# Patient Record
Sex: Male | Born: 1975 | Race: Black or African American | Hispanic: No | Marital: Single | Smoking: Never smoker
Health system: Southern US, Community
[De-identification: ages and names within clinical notes are randomized; demographics above are authoritative.]

## PROBLEM LIST (undated history)

## (undated) DIAGNOSIS — J45909 Unspecified asthma, uncomplicated: Secondary | ICD-10-CM

## (undated) HISTORY — PX: NASAL SINUS SURGERY: SHX719

---

## 2000-10-24 ENCOUNTER — Encounter: Payer: Self-pay | Admitting: Otolaryngology

## 2000-10-24 ENCOUNTER — Encounter: Admission: RE | Admit: 2000-10-24 | Discharge: 2000-10-24 | Payer: Self-pay | Admitting: Otolaryngology

## 2001-04-13 ENCOUNTER — Emergency Department (HOSPITAL_COMMUNITY): Admission: EM | Admit: 2001-04-13 | Discharge: 2001-04-13 | Payer: Self-pay | Admitting: Emergency Medicine

## 2001-08-21 ENCOUNTER — Encounter: Admission: RE | Admit: 2001-08-21 | Discharge: 2001-08-21 | Payer: Self-pay | Admitting: Otolaryngology

## 2001-08-21 ENCOUNTER — Encounter: Payer: Self-pay | Admitting: Otolaryngology

## 2001-08-31 ENCOUNTER — Other Ambulatory Visit: Admission: RE | Admit: 2001-08-31 | Discharge: 2001-08-31 | Payer: Self-pay | Admitting: Otolaryngology

## 2004-01-02 ENCOUNTER — Encounter: Admission: RE | Admit: 2004-01-02 | Discharge: 2004-01-02 | Payer: Self-pay | Admitting: Family Medicine

## 2004-08-17 ENCOUNTER — Encounter: Admission: RE | Admit: 2004-08-17 | Discharge: 2004-08-17 | Payer: Self-pay | Admitting: Otolaryngology

## 2004-12-21 ENCOUNTER — Encounter: Admission: RE | Admit: 2004-12-21 | Discharge: 2004-12-21 | Payer: Self-pay | Admitting: Family Medicine

## 2005-01-05 ENCOUNTER — Ambulatory Visit (HOSPITAL_COMMUNITY): Admission: RE | Admit: 2005-01-05 | Discharge: 2005-01-05 | Payer: Self-pay | Admitting: Otolaryngology

## 2005-01-05 ENCOUNTER — Ambulatory Visit (HOSPITAL_BASED_OUTPATIENT_CLINIC_OR_DEPARTMENT_OTHER): Admission: RE | Admit: 2005-01-05 | Discharge: 2005-01-05 | Payer: Self-pay | Admitting: Otolaryngology

## 2006-05-22 ENCOUNTER — Emergency Department (HOSPITAL_COMMUNITY): Admission: EM | Admit: 2006-05-22 | Discharge: 2006-05-22 | Payer: Self-pay | Admitting: Family Medicine

## 2006-12-23 ENCOUNTER — Emergency Department (HOSPITAL_COMMUNITY): Admission: EM | Admit: 2006-12-23 | Discharge: 2006-12-24 | Payer: Self-pay | Admitting: Emergency Medicine

## 2013-01-20 ENCOUNTER — Ambulatory Visit (INDEPENDENT_AMBULATORY_CARE_PROVIDER_SITE_OTHER): Payer: BC Managed Care – PPO | Admitting: Family Medicine

## 2013-01-20 VITALS — BP 136/78 | HR 66 | Temp 98.6°F | Resp 16 | Ht 70.58 in | Wt 223.4 lb

## 2013-01-20 DIAGNOSIS — L0201 Cutaneous abscess of face: Secondary | ICD-10-CM

## 2013-01-20 MED ORDER — CEFTRIAXONE SODIUM 1 G IJ SOLR
1.0000 g | INTRAMUSCULAR | Status: AC
  Administered 2013-01-20: 1 g via INTRAMUSCULAR

## 2013-01-20 MED ORDER — DOXYCYCLINE HYCLATE 100 MG PO TABS: 100.0000 mg | ORAL_TABLET | Freq: Two times a day (BID) | ORAL | Status: AC

## 2013-01-20 NOTE — Patient Instructions (Addendum)
Start the oral antibiotic this afternoon- take 1 pill twice a day with food and water. You may take ibuprofen for pain and swelling- up to 800 mg three times a day Apply hot compresses to help the area drain, and ice as needed for pain and swelling Take it easy and drink plenty of fluids If you are not better in the next 36 hours please come back in or call.

## 2013-01-20 NOTE — Progress Notes (Signed)
[facility] [address], [place] [address] [phone]  Date:  01/20/2013   Name:  Kyle Hill   DOB:  1976-08-30   MRN:  [id number]  PCP:  No primary provider on file.    Chief Complaint: Abscess   History of Present Illness:  Kyle Hill is a 37 y.o. very pleasant male patient who presents with the following:  He has a very sore lesion on his upper lip- seems like a boil.  It seems to be getting larger.  He has tried to squeeze it but has not gotten any discharge yet.  It really hurts to try and squeeze it.  He has never had anything like this in the past- he has had "hair bumps" but nothing this severe He otherwise feels fine, no fever, no headache.  He does feel a little bit tired but knows he has not been sleeping enough  He is generally healthy and has no other concerns today  There is no problem list on file for this patient.   No past medical history on file.  No past surgical history on file.  History  Substance Use Topics  . Smoking status: Never Smoker   . Smokeless tobacco: Not on file  . Alcohol Use: 0.6 oz/week    1 Glasses of wine per week    Family History  Problem Relation Age of Onset  . Heart disease Mother   . Stroke Father     No Known Allergies  Medication list has been reviewed and updated.  No current outpatient prescriptions on file prior to visit.   No current facility-administered medications on file prior to visit.    Review of Systems:  As per HPI- otherwise negative.   Physical Examination: Filed Vitals:   01/20/13 1129  BP: 136/78  Pulse: 66  Temp: 98.6 F (37 C)  Resp: 16   Filed Vitals:   01/20/13 1129  Height: 5' 10.58" (1.793 m)  Weight: 223 lb 6.4 oz (101.334 kg)   Body mass index is 31.52 kg/(m^2). Ideal Body Weight: Weight in (lb) to have BMI = 25: 176.8  GEN: WDWN, NAD, Non-toxic, A & O x 3 HEENT: Atraumatic, Normocephalic. Neck supple. No masses, No LAD.  Bilateral TM wnl,  oropharynx normal.  PEERL,EOMI.   On the right side of his upper lip, just below the right nare is a very tender, slightly swollen, slightly warm area of cellulitis.  There is no fluctuance or drainage.   Ears and Nose: No external deformity. CV: RRR, No M/G/R. No JVD. No thrill. No extra heart sounds. PULM: CTA B, no wheezes, crackles, rhonchi. No retractions. No resp. distress. No accessory muscle use. EXTR: No c/c/e NEURO Normal gait.  PSYCH: Normally interactive. Conversant. Not depressed or anxious appearing.  Calm demeanor.    Assessment and Plan: Cellulitis and abscess of face - Plan: cefTRIAXone (ROCEPHIN) injection 1 g, doxycycline (VIBRA-TABS) 100 MG tablet  Treat with rocephin and doxycycline, ibuprofen as needed.  Encouraged close follow- up if not better, Sooner if worse.   See patient instructions for more details.     Abbe Amsterdam, MD

## 2016-02-24 DIAGNOSIS — H40023 Open angle with borderline findings, high risk, bilateral: Secondary | ICD-10-CM | POA: Diagnosis not present

## 2016-06-09 ENCOUNTER — Other Ambulatory Visit: Payer: Self-pay | Admitting: Family Medicine

## 2016-06-09 DIAGNOSIS — R1031 Right lower quadrant pain: Secondary | ICD-10-CM | POA: Diagnosis not present

## 2016-06-10 ENCOUNTER — Other Ambulatory Visit: Payer: Self-pay | Admitting: Family Medicine

## 2016-06-10 DIAGNOSIS — R102 Pelvic and perineal pain: Secondary | ICD-10-CM | POA: Diagnosis not present

## 2016-06-10 DIAGNOSIS — M799 Soft tissue disorder, unspecified: Secondary | ICD-10-CM | POA: Diagnosis not present

## 2016-06-10 DIAGNOSIS — R1031 Right lower quadrant pain: Secondary | ICD-10-CM

## 2016-06-10 DIAGNOSIS — R1030 Lower abdominal pain, unspecified: Secondary | ICD-10-CM | POA: Diagnosis not present

## 2016-06-28 DIAGNOSIS — L662 Folliculitis decalvans: Secondary | ICD-10-CM | POA: Diagnosis not present

## 2016-06-28 DIAGNOSIS — L739 Follicular disorder, unspecified: Secondary | ICD-10-CM | POA: Diagnosis not present

## 2016-06-29 DIAGNOSIS — L02214 Cutaneous abscess of groin: Secondary | ICD-10-CM | POA: Diagnosis not present

## 2016-07-23 ENCOUNTER — Other Ambulatory Visit: Payer: Self-pay | Admitting: Surgery

## 2016-07-23 DIAGNOSIS — L02214 Cutaneous abscess of groin: Secondary | ICD-10-CM | POA: Diagnosis not present

## 2016-08-12 ENCOUNTER — Other Ambulatory Visit: Payer: Self-pay | Admitting: Surgery

## 2016-08-12 DIAGNOSIS — L02214 Cutaneous abscess of groin: Secondary | ICD-10-CM | POA: Diagnosis not present

## 2016-08-20 DIAGNOSIS — Z0000 Encounter for general adult medical examination without abnormal findings: Secondary | ICD-10-CM | POA: Diagnosis not present

## 2016-08-20 DIAGNOSIS — R7309 Other abnormal glucose: Secondary | ICD-10-CM | POA: Diagnosis not present

## 2016-12-29 DIAGNOSIS — L662 Folliculitis decalvans: Secondary | ICD-10-CM | POA: Diagnosis not present

## 2016-12-29 DIAGNOSIS — L739 Follicular disorder, unspecified: Secondary | ICD-10-CM | POA: Diagnosis not present

## 2017-09-05 DIAGNOSIS — Z131 Encounter for screening for diabetes mellitus: Secondary | ICD-10-CM | POA: Diagnosis not present

## 2017-09-05 DIAGNOSIS — Z0000 Encounter for general adult medical examination without abnormal findings: Secondary | ICD-10-CM | POA: Diagnosis not present

## 2017-09-05 DIAGNOSIS — Z13220 Encounter for screening for lipoid disorders: Secondary | ICD-10-CM | POA: Diagnosis not present

## 2017-09-14 DIAGNOSIS — L662 Folliculitis decalvans: Secondary | ICD-10-CM | POA: Diagnosis not present

## 2017-09-14 DIAGNOSIS — L739 Follicular disorder, unspecified: Secondary | ICD-10-CM | POA: Diagnosis not present

## 2018-01-17 DIAGNOSIS — L089 Local infection of the skin and subcutaneous tissue, unspecified: Secondary | ICD-10-CM | POA: Diagnosis not present

## 2018-01-17 DIAGNOSIS — L662 Folliculitis decalvans: Secondary | ICD-10-CM | POA: Diagnosis not present

## 2018-01-17 DIAGNOSIS — L739 Follicular disorder, unspecified: Secondary | ICD-10-CM | POA: Diagnosis not present

## 2018-03-01 DIAGNOSIS — L662 Folliculitis decalvans: Secondary | ICD-10-CM | POA: Diagnosis not present

## 2018-03-01 DIAGNOSIS — L089 Local infection of the skin and subcutaneous tissue, unspecified: Secondary | ICD-10-CM | POA: Diagnosis not present

## 2019-09-18 ENCOUNTER — Encounter (INDEPENDENT_AMBULATORY_CARE_PROVIDER_SITE_OTHER): Payer: Self-pay

## 2020-11-12 DIAGNOSIS — L662 Folliculitis decalvans: Secondary | ICD-10-CM | POA: Diagnosis not present

## 2020-11-12 DIAGNOSIS — L089 Local infection of the skin and subcutaneous tissue, unspecified: Secondary | ICD-10-CM | POA: Diagnosis not present

## 2021-01-20 ENCOUNTER — Other Ambulatory Visit: Payer: Self-pay | Admitting: Family Medicine

## 2021-01-20 ENCOUNTER — Ambulatory Visit
Admission: RE | Admit: 2021-01-20 | Discharge: 2021-01-20 | Disposition: A | Discharge status: Home / Self Care | Payer: Self-pay | Source: Ambulatory Visit | Attending: Family Medicine | Admitting: Family Medicine

## 2021-01-20 DIAGNOSIS — R140 Abdominal distension (gaseous): Secondary | ICD-10-CM

## 2021-01-20 DIAGNOSIS — R350 Frequency of micturition: Secondary | ICD-10-CM | POA: Diagnosis not present

## 2021-08-06 DIAGNOSIS — L308 Other specified dermatitis: Secondary | ICD-10-CM | POA: Diagnosis not present

## 2021-08-20 DIAGNOSIS — H524 Presbyopia: Secondary | ICD-10-CM | POA: Diagnosis not present

## 2021-08-20 DIAGNOSIS — H5310 Unspecified subjective visual disturbances: Secondary | ICD-10-CM | POA: Diagnosis not present

## 2021-08-24 ENCOUNTER — Ambulatory Visit (INDEPENDENT_AMBULATORY_CARE_PROVIDER_SITE_OTHER): Payer: Self-pay | Admitting: Otolaryngology

## 2021-09-14 ENCOUNTER — Ambulatory Visit (INDEPENDENT_AMBULATORY_CARE_PROVIDER_SITE_OTHER): Payer: BLUE CROSS/BLUE SHIELD | Admitting: Otolaryngology

## 2021-09-14 ENCOUNTER — Other Ambulatory Visit: Payer: Self-pay

## 2021-09-14 DIAGNOSIS — J310 Chronic rhinitis: Secondary | ICD-10-CM | POA: Diagnosis not present

## 2021-09-14 MED ORDER — TRIAMCINOLONE ACETONIDE 55 MCG/ACT NA AERO: 2.0000 | INHALATION_SPRAY | Freq: Every day | NASAL | 12 refills | Status: AC

## 2021-09-14 NOTE — Progress Notes (Signed)
HPI: Kyle Hill is a 45 y.o. male who returns today for evaluation of intermittent nasal congestion and allergies.  Patient is presently using Flonase as well as occasional Zyrtec.  He has had previous surgery with myself for removal of sinonasal polyps in 1994, 2002 and 2006.  He is doing reasonly well presently he has had occasional sinus infection where he has yellow-green discharge but no symptoms presently.Marland Kitchen  No past medical history on file.  Social History   Socioeconomic History   Marital status: Married    Spouse name: Not on file   Number of children: Not on file   Years of education: Not on file   Highest education level: Not on file  Occupational History   Not on file  Tobacco Use   Smoking status: Never   Smokeless tobacco: Not on file  Substance and Sexual Activity   Alcohol use: Yes    Alcohol/week: 1.0 standard drink    Types: 1 Glasses of wine per week   Drug use: No   Sexual activity: Yes  Other Topics Concern   Not on file  Social History Narrative   Not on file   Social Determinants of Health   Financial Resource Strain: Not on file  Food Insecurity: Not on file  Transportation Needs: Not on file  Physical Activity: Not on file  Stress: Not on file  Social Connections: Not on file   Family History  Problem Relation Age of Onset   Heart disease Mother    Stroke Father    No Known Allergies Prior to Admission medications   Medication Sig Start Date End Date Taking? Authorizing Provider  doxycycline (VIBRA-TABS) 100 MG tablet Take 1 tablet (100 mg total) by mouth 2 (two) times daily. 01/20/13   Copland, Gwenlyn Found, MD     Positive ROS: Otherwise negative  All other systems have been reviewed and were otherwise negative with the exception of those mentioned in the HPI and as above.  Physical Exam: Constitutional: Alert, well-appearing, no acute distress Ears: External ears without lesions or tenderness. Ear canals are clear bilaterally with  intact, clear TMs.  Nasal: External nose without lesions. Septum midline with mild rhinitis.  On nasal endoscopy he had minimal polypoid changes on the left side that is nonobstructing.  No obvious mucopurulent discharge noted.  The nasopharynx was clear.  No significant polyps noted on either side.  Nasopharynx is otherwise clear. Oral: Lips and gums without lesions. Tongue and palate mucosa without lesions. Posterior oropharynx clear. Neck: No palpable adenopathy or masses Respiratory: Breathing comfortably  Skin: No facial/neck lesions or rash noted.  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 Doing well presently.  Plan: Discussed with him concerning regular use of nasal steroid spray either Flonase or Nasacort if he is having any nasal sinus congestion.  Presently no evidence of active infection and nasal passages are clear. Also reviewed with him concerning use of saline nasal irrigations and suggested trying AYR and Xlear saline rinses. He will follow-up as needed. Also discussed with him concerning my retirement and need for follow-up with one of the other ENT groups if he has trouble with his breathing or sinus infections. Refilled Nasacort for him   Narda Bonds, MD

## 2021-09-29 IMAGING — DX DG ABDOMEN 2V
4 series · 4 of 4 positions shown · non-contrast
Comparison: None.

CLINICAL DATA: Abdominal bloating for 6 months.

EXAM:
ABDOMEN - 2 VIEW

[dg abd 2 views (1 of 4)]
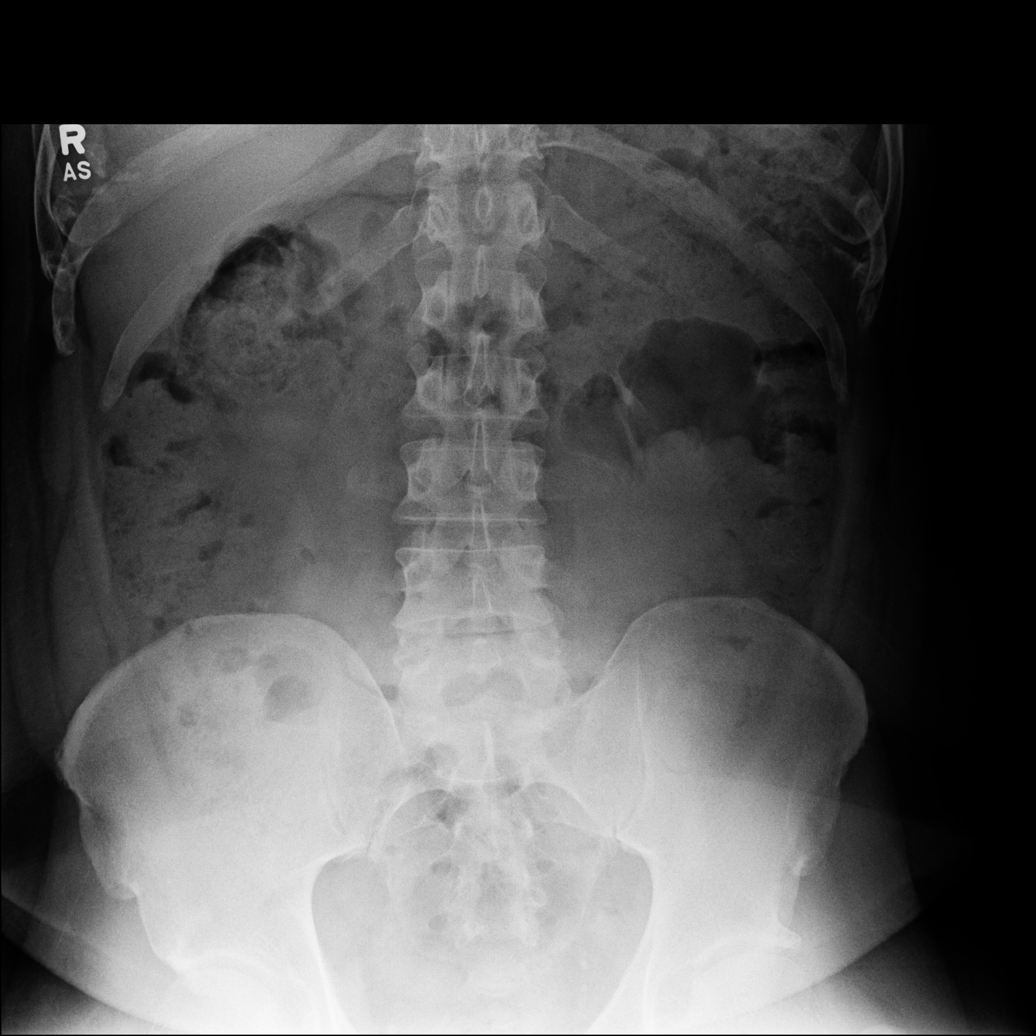

[dg abd 2 views (2 of 4)]
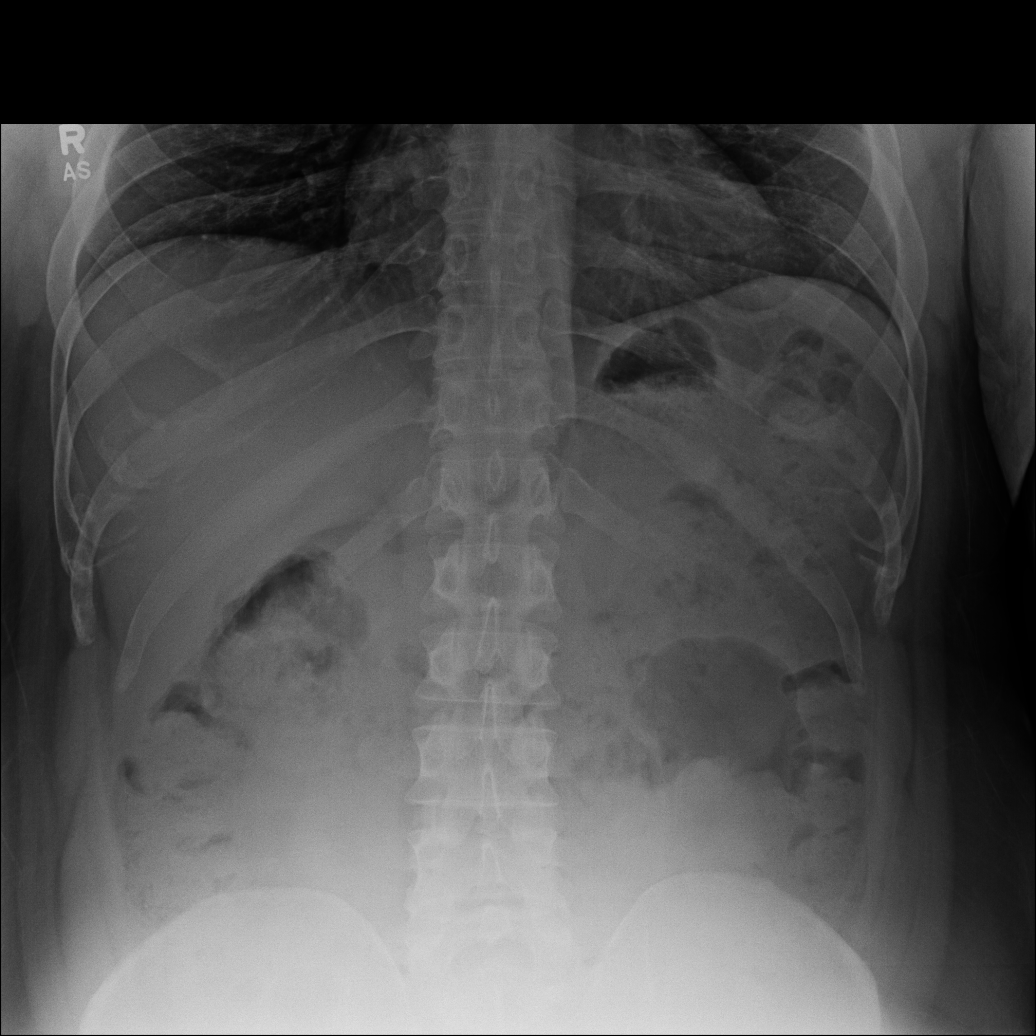

[dg abd 2 views (3 of 4)]
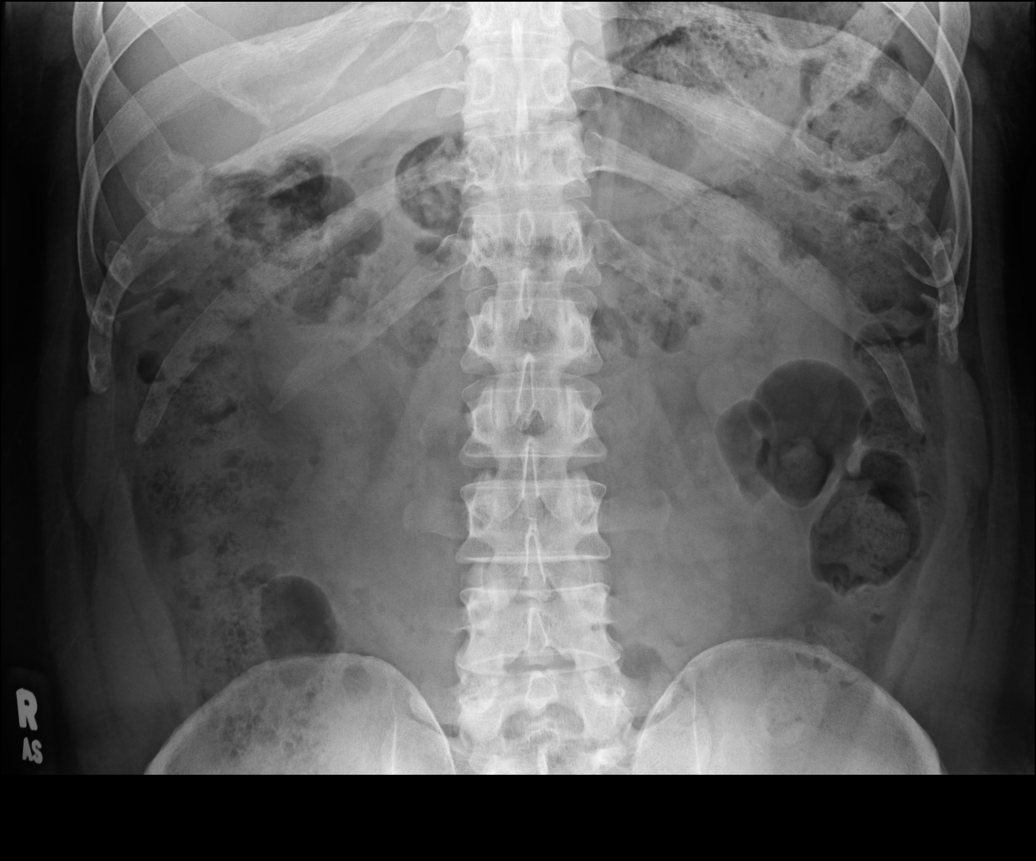

[dg abd 2 views (4 of 4)]
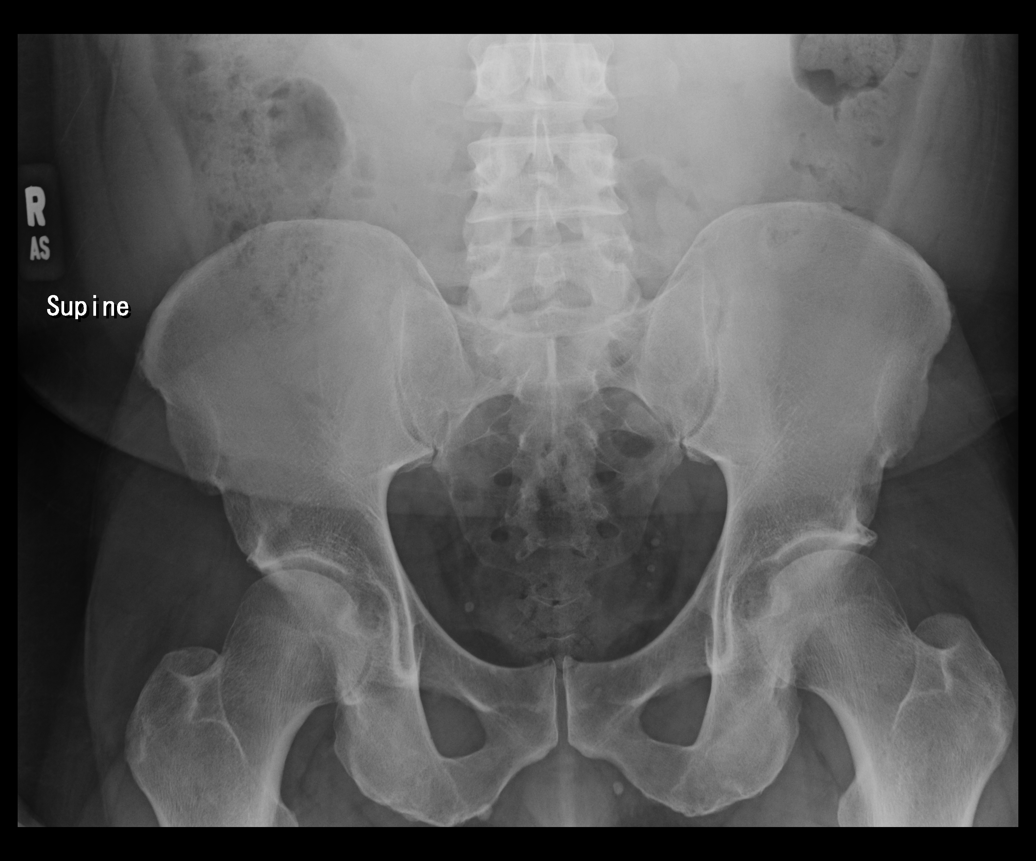

[4 of 4 positions shown; findings below may reference images not displayed]

FINDINGS: Supine and upright views of the abdomen obtained. There is no free
intra-abdominal air. No bowel dilatation to suggest obstruction. No
air-fluid levels. Moderate volume of stool throughout the ascending,
transverse, and descending colon. Small volume of rectosigmoid
stool. Multiple pelvic calcifications are typical of phleboliths. No
abdominal soft tissue calcifications or radiopaque calculi.
Generalized paucity of small bowel gas which is nonspecific.
Included lung bases are clear. No osseous abnormalities are seen.
IMPRESSION: Moderate colonic stool burden. No bowel obstruction.

## 2021-10-21 DIAGNOSIS — Z79899 Other long term (current) drug therapy: Secondary | ICD-10-CM | POA: Diagnosis not present

## 2021-10-21 DIAGNOSIS — L662 Folliculitis decalvans: Secondary | ICD-10-CM | POA: Diagnosis not present

## 2021-12-11 DIAGNOSIS — L308 Other specified dermatitis: Secondary | ICD-10-CM | POA: Diagnosis not present

## 2022-02-10 DIAGNOSIS — Z79899 Other long term (current) drug therapy: Secondary | ICD-10-CM | POA: Diagnosis not present

## 2022-02-10 DIAGNOSIS — L2084 Intrinsic (allergic) eczema: Secondary | ICD-10-CM | POA: Diagnosis not present

## 2022-02-10 DIAGNOSIS — L662 Folliculitis decalvans: Secondary | ICD-10-CM | POA: Diagnosis not present

## 2022-05-31 DIAGNOSIS — Z8249 Family history of ischemic heart disease and other diseases of the circulatory system: Secondary | ICD-10-CM | POA: Diagnosis not present

## 2022-05-31 DIAGNOSIS — Z131 Encounter for screening for diabetes mellitus: Secondary | ICD-10-CM | POA: Diagnosis not present

## 2022-05-31 DIAGNOSIS — Z13220 Encounter for screening for lipoid disorders: Secondary | ICD-10-CM | POA: Diagnosis not present

## 2022-05-31 DIAGNOSIS — Z125 Encounter for screening for malignant neoplasm of prostate: Secondary | ICD-10-CM | POA: Diagnosis not present

## 2022-05-31 DIAGNOSIS — Z6831 Body mass index (BMI) 31.0-31.9, adult: Secondary | ICD-10-CM | POA: Diagnosis not present

## 2022-05-31 DIAGNOSIS — Z0000 Encounter for general adult medical examination without abnormal findings: Secondary | ICD-10-CM | POA: Diagnosis not present

## 2022-06-14 DIAGNOSIS — Z5181 Encounter for therapeutic drug level monitoring: Secondary | ICD-10-CM | POA: Diagnosis not present

## 2022-06-14 DIAGNOSIS — L089 Local infection of the skin and subcutaneous tissue, unspecified: Secondary | ICD-10-CM | POA: Diagnosis not present

## 2022-06-14 DIAGNOSIS — Z79899 Other long term (current) drug therapy: Secondary | ICD-10-CM | POA: Diagnosis not present

## 2022-06-14 DIAGNOSIS — L662 Folliculitis decalvans: Secondary | ICD-10-CM | POA: Diagnosis not present

## 2022-08-18 DIAGNOSIS — H5319 Other subjective visual disturbances: Secondary | ICD-10-CM | POA: Diagnosis not present

## 2022-08-18 DIAGNOSIS — H524 Presbyopia: Secondary | ICD-10-CM | POA: Diagnosis not present

## 2022-10-11 ENCOUNTER — Other Ambulatory Visit: Payer: Self-pay

## 2022-10-11 ENCOUNTER — Emergency Department (HOSPITAL_BASED_OUTPATIENT_CLINIC_OR_DEPARTMENT_OTHER): Payer: BLUE CROSS/BLUE SHIELD

## 2022-10-11 ENCOUNTER — Emergency Department (HOSPITAL_BASED_OUTPATIENT_CLINIC_OR_DEPARTMENT_OTHER)
Admission: EM | Admit: 2022-10-11 | Discharge: 2022-10-11 | Disposition: A | Discharge status: Home / Self Care | Payer: BLUE CROSS/BLUE SHIELD | Attending: Emergency Medicine | Admitting: Emergency Medicine

## 2022-10-11 ENCOUNTER — Encounter (HOSPITAL_BASED_OUTPATIENT_CLINIC_OR_DEPARTMENT_OTHER): Payer: Self-pay

## 2022-10-11 DIAGNOSIS — M5441 Lumbago with sciatica, right side: Secondary | ICD-10-CM | POA: Diagnosis not present

## 2022-10-11 DIAGNOSIS — M5442 Lumbago with sciatica, left side: Secondary | ICD-10-CM | POA: Diagnosis not present

## 2022-10-11 DIAGNOSIS — M545 Low back pain, unspecified: Secondary | ICD-10-CM | POA: Diagnosis not present

## 2022-10-11 HISTORY — DX: Unspecified asthma, uncomplicated: J45.909

## 2022-10-11 LAB — URINALYSIS, ROUTINE W REFLEX MICROSCOPIC
Bilirubin Urine: NEGATIVE
Glucose, UA: NEGATIVE mg/dL
Ketones, ur: NEGATIVE mg/dL
Leukocytes,Ua: NEGATIVE
Nitrite: NEGATIVE
Protein, ur: NEGATIVE mg/dL
Specific Gravity, Urine: >=1.03 (ref 1.005–1.030)
pH: 5 (ref 5.0–8.0)

## 2022-10-11 LAB — URINALYSIS, MICROSCOPIC (REFLEX): WBC, UA: NONE SEEN WBC/hpf (ref 0–5)

## 2022-10-11 MED ORDER — METHOCARBAMOL 500 MG PO TABS
500.0000 mg | ORAL_TABLET | Freq: Once | ORAL | Status: AC
  Administered 2022-10-11: 500 mg via ORAL
  Filled 2022-10-11: qty 1

## 2022-10-11 MED ORDER — METHOCARBAMOL 500 MG PO TABS: 500.0000 mg | ORAL_TABLET | Freq: Three times a day (TID) | ORAL | 0 refills | Status: AC | PRN

## 2022-10-11 MED ORDER — PREDNISONE 50 MG PO TABS
60.0000 mg | ORAL_TABLET | Freq: Once | ORAL | Status: AC
  Administered 2022-10-11: 60 mg via ORAL
  Filled 2022-10-11: qty 1

## 2022-10-11 MED ORDER — METHYLPREDNISOLONE 4 MG PO TBPK: ORAL_TABLET | ORAL | 0 refills | Status: AC

## 2022-10-11 NOTE — Discharge Instructions (Signed)
Take the steroids and muscle relaxers as prescribed.  Follow-up with your primary doctor as well as a spine doctor.  Return to the ED with worsening pain, weakness, numbness, tingling, bowel or bladder incontinence, other concerns.

## 2022-10-11 NOTE — ED Provider Notes (Signed)
[facility] EMERGENCY DEPARTMENT Provider Note   CSN: [id number] Arrival date & time: 10/11/22  1901     History  Chief Complaint  Patient presents with   Back Pain    Kyle Hill is a 46 y.o. male.  Patient presents with a 84-month history of bilateral low back pain that radiates to both of his hips and upper legs.  States the pain is fairly constant nothing makes it better.  Has been taking Tylenol without relief.  Denies any injury.  He does computer work.  No previous back surgery.  Has had intermittent back pain in the past but never this bad.  Pain starts in his low back and radiates to both legs worse on the left side.  Some occasional numbness and tingling to his right toes.  Better with lying flat and worse with sitting.  No pain with urination or blood in the urine.  No fever, chills, nausea or vomiting.  No chest pain or shortness of breath.  No bowel or bladder incontinence.  No focal weakness.  Some occasional numbness to his right foot.  No history of IV drug abuse or cancer.  No chest pain or shortness of breath.  He has never had any imaging of his back.  The history is provided by the patient.  Back Pain Associated symptoms: numbness   Associated symptoms: no abdominal pain, no chest pain, no dysuria and no fever        Home Medications Prior to Admission medications   Medication Sig Start Date End Date Taking? Authorizing Provider  doxycycline (VIBRA-TABS) 100 MG tablet Take 1 tablet (100 mg total) by mouth 2 (two) times daily. 01/20/13   Copland, Gwenlyn Found, MD  triamcinolone (NASACORT) 55 MCG/ACT AERO nasal inhaler Place 2 sprays into the nose daily. 2 sprays each nostril at night 09/14/21   Drema Halon, MD      Allergies    Patient has no known allergies.    Review of Systems   Review of Systems  Constitutional:  Negative for activity change, appetite change and fever.  HENT:  Negative for congestion and rhinorrhea.   Respiratory:   Negative for cough, chest tightness and shortness of breath.   Cardiovascular:  Negative for chest pain.  Gastrointestinal:  Negative for abdominal pain and vomiting.  Genitourinary:  Negative for dysuria.  Musculoskeletal:  Positive for back pain.  Skin:  Negative for rash.  Neurological:  Positive for numbness.   all other systems are negative except as noted in the HPI and PMH.    Physical Exam Updated Vital Signs BP (!) 131/91 (BP Location: Left Arm)   Pulse 64   Temp 98.4 F (36.9 C) (Oral)   Resp 18   Ht 6' (1.829 m)   Wt 98 kg   SpO2 95%   BMI 29.29 kg/m  Physical Exam Vitals and nursing note reviewed.  Constitutional:      General: He is not in acute distress.    Appearance: He is well-developed.  HENT:     Head: Normocephalic and atraumatic.     Mouth/Throat:     Pharynx: No oropharyngeal exudate.  Eyes:     Conjunctiva/sclera: Conjunctivae normal.     Pupils: Pupils are equal, round, and reactive to light.  Neck:     Comments: No meningismus. Cardiovascular:     Rate and Rhythm: Normal rate and regular rhythm.     Heart sounds: Normal heart sounds. No murmur heard. Pulmonary:  Effort: Pulmonary effort is normal. No respiratory distress.     Breath sounds: Normal breath sounds.  Abdominal:     Palpations: Abdomen is soft.     Tenderness: There is no abdominal tenderness. There is no guarding or rebound.  Musculoskeletal:        General: Tenderness present. Normal range of motion.     Cervical back: Normal range of motion and neck supple.     Comments: Bilateral paraspinal lumbar tenderness, no midline tenderness  5/5 strength in bilateral lower extremities. Ankle plantar and dorsiflexion intact. Great toe extension intact bilaterally. +2 DP and PT pulses. +2 patellar reflexes bilaterally. Normal gait.   Skin:    General: Skin is warm.  Neurological:     Mental Status: He is alert and oriented to person, place, and time.     Cranial Nerves: No  cranial nerve deficit.     Motor: No abnormal muscle tone.     Coordination: Coordination normal.     Comments: No ataxia on finger to nose bilaterally. No pronator drift. 5/5 strength throughout. CN 2-12 intact.Equal grip strength. Sensation intact.   Psychiatric:        Behavior: Behavior normal.     ED Results / Procedures / Treatments   Labs (all labs ordered are listed, but only abnormal results are displayed) Labs Reviewed  URINALYSIS, ROUTINE W REFLEX MICROSCOPIC - Abnormal; Notable for the following components:      Result Value   Hgb urine dipstick TRACE (*)    All other components within normal limits  URINALYSIS, MICROSCOPIC (REFLEX) - Abnormal; Notable for the following components:   Bacteria, UA RARE (*)    All other components within normal limits    EKG None  Radiology CT Lumbar Spine Wo Contrast  Result Date: 10/11/2022 CLINICAL DATA:  Lower back pain. EXAM: CT LUMBAR SPINE WITHOUT CONTRAST TECHNIQUE: Multidetector CT imaging of the lumbar spine was performed without intravenous contrast administration. Multiplanar CT image reconstructions were also generated. RADIATION DOSE REDUCTION: This exam was performed according to the departmental dose-optimization program which includes automated exposure control, adjustment of the mA and/or kV according to patient size and/or use of iterative reconstruction technique. COMPARISON:  KUB 01/20/2021 FINDINGS: Segmentation: 5 lumbar type vertebrae. Alignment: 2 mm retrolisthesis of L5 on S1. Normal frontal alignment. Vertebrae: Vertebral body heights are maintained. Minimal posterior L5-S1 disc space narrowing. Mild L5-S1 degenerative vacuum disc. The visualized portions of the bilateral sacroiliac joints are maintained. Paraspinal and other soft tissues: Negative. Disc levels: L1-2: No posterior disc bulge, central canal narrowing, or neuroforaminal stenosis. L2-3: No posterior disc bulge, central canal narrowing, or neuroforaminal  stenosis. L3-4: Mild posterior left disc bulge. No central canal stenosis. Mild left lateral recess narrowing. No neuroforaminal stenosis. L4-5: Mild-to-moderate bilateral facet joint hypertrophy. Mild to moderate broad-based posterior disc bulge measuring up to 6 mm in AP dimension with likely mild mass effect on the bilateral descending L5 nerves within the lateral recesses. Moderate narrowing of the lateral recesses bilaterally. Mild central canal stenosis. No significant neuroforaminal stenosis. L5-S1: Mild broad-based posterior disc osteophyte complex with minimal bilateral intraforaminal extension. Disc osteophyte complex contacts the bilateral descending S1 nerves within the lateral recesses. No central canal stenosis. No significant neuroforaminal stenosis. IMPRESSION: 1. Multilevel degenerative disc and joint changes as above. 2. At L4-5 there is moderate broad-based posterior disc bulge likely with mild mass effect on the bilateral descending L5 nerves within the lateral recesses. There is moderate narrowing of the lateral recesses  and mild central canal stenosis. 3. At L5-S1 there is a broad-based posterior disc osteophyte complex that mildly contacts the bilateral descending S1 nerves within the lateral recesses. No central canal stenosis. 4. At L3-4 there is a mild posterior left disc bulge with mild left lateral recess narrowing. No neuroforaminal stenosis. Electronically Signed   By: Neita Garnet M.D.   On: 10/11/2022 22:05    Procedures Procedures    Medications Ordered in ED Medications  methocarbamol (ROBAXIN) tablet 500 mg (500 mg Oral Given 10/11/22 2144)  predniSONE (DELTASONE) tablet 60 mg (60 mg Oral Given 10/11/22 2144)    ED Course/ Medical Decision Making/ A&P                           Medical Decision Making Amount and/or Complexity of Data Reviewed Labs: ordered. Radiology: ordered.  Risk Prescription drug management.   2 months of low back pain with occasional  numbness to his bilateral legs.  Intact distal strength, sensation, pulses and reflexes.  No bowel or bladder incontinence.  No fever.  No history of IV drug abuse or cancer.   Low suspicion for cord compression or cauda equina.  Urinalysis is negative.  Postvoid residual is normal 30mL.  CT scan as above shows multilevel disc disease and disc protrusion.  No indication for emergent transfer for MRI tonight  Patient tolerating p.o. and ambulatory.  CT scan as above shows large base protrusions at L3/L4, L5/S1.  We will initiate steroids and muscle relaxers.  Follow-up with PCP as well as neurosurgery.  Discussed with patient that next step is MRI.  Return to the ED sooner with worsening pain weakness, numbness, tingling, bowel or bladder incontinence or any concerns       Final Clinical Impression(s) / ED Diagnoses Final diagnoses:  Acute midline low back pain with bilateral sciatica    Rx / DC Orders ED Discharge Orders     None         Anamaria Dusenbury, Jeannett Senior, MD 10/11/22 2315

## 2022-10-11 NOTE — ED Triage Notes (Signed)
Pt reports x2 months of lower back pain with BLE numbness. Pt has chronic back issues; pt reports taking OTC meds with no relief. Pt able to ambulate, but painful after sitting for a long time. Pt reports x4 days or irregular urination; denies being incontinent.

## 2022-10-18 DIAGNOSIS — M5416 Radiculopathy, lumbar region: Secondary | ICD-10-CM | POA: Diagnosis not present

## 2022-11-02 DIAGNOSIS — M47816 Spondylosis without myelopathy or radiculopathy, lumbar region: Secondary | ICD-10-CM | POA: Diagnosis not present

## 2022-11-02 DIAGNOSIS — M5442 Lumbago with sciatica, left side: Secondary | ICD-10-CM | POA: Diagnosis not present

## 2022-11-02 DIAGNOSIS — M5441 Lumbago with sciatica, right side: Secondary | ICD-10-CM | POA: Diagnosis not present

## 2022-11-02 DIAGNOSIS — G8929 Other chronic pain: Secondary | ICD-10-CM | POA: Diagnosis not present

## 2022-12-20 DIAGNOSIS — Z5181 Encounter for therapeutic drug level monitoring: Secondary | ICD-10-CM | POA: Diagnosis not present

## 2022-12-20 DIAGNOSIS — L662 Folliculitis decalvans: Secondary | ICD-10-CM | POA: Diagnosis not present

## 2022-12-20 DIAGNOSIS — Z79899 Other long term (current) drug therapy: Secondary | ICD-10-CM | POA: Diagnosis not present

## 2023-01-25 DIAGNOSIS — M5442 Lumbago with sciatica, left side: Secondary | ICD-10-CM | POA: Diagnosis not present

## 2023-01-25 DIAGNOSIS — Z6829 Body mass index (BMI) 29.0-29.9, adult: Secondary | ICD-10-CM | POA: Diagnosis not present

## 2023-05-11 DIAGNOSIS — M5440 Lumbago with sciatica, unspecified side: Secondary | ICD-10-CM | POA: Diagnosis not present

## 2023-05-11 DIAGNOSIS — M79605 Pain in left leg: Secondary | ICD-10-CM | POA: Diagnosis not present

## 2023-06-07 DIAGNOSIS — Z125 Encounter for screening for malignant neoplasm of prostate: Secondary | ICD-10-CM | POA: Diagnosis not present

## 2023-06-07 DIAGNOSIS — Z0000 Encounter for general adult medical examination without abnormal findings: Secondary | ICD-10-CM | POA: Diagnosis not present

## 2023-06-07 DIAGNOSIS — Z13220 Encounter for screening for lipoid disorders: Secondary | ICD-10-CM | POA: Diagnosis not present

## 2023-06-07 DIAGNOSIS — Z131 Encounter for screening for diabetes mellitus: Secondary | ICD-10-CM | POA: Diagnosis not present

## 2023-06-20 DIAGNOSIS — L662 Folliculitis decalvans: Secondary | ICD-10-CM | POA: Diagnosis not present

## 2023-06-20 DIAGNOSIS — Z79899 Other long term (current) drug therapy: Secondary | ICD-10-CM | POA: Diagnosis not present

## 2023-06-20 DIAGNOSIS — L663 Perifolliculitis capitis abscedens: Secondary | ICD-10-CM | POA: Diagnosis not present

## 2023-08-25 DIAGNOSIS — H524 Presbyopia: Secondary | ICD-10-CM | POA: Diagnosis not present

## 2023-09-05 DIAGNOSIS — K621 Rectal polyp: Secondary | ICD-10-CM | POA: Diagnosis not present

## 2023-09-05 DIAGNOSIS — Z1211 Encounter for screening for malignant neoplasm of colon: Secondary | ICD-10-CM | POA: Diagnosis not present

## 2023-09-05 DIAGNOSIS — K635 Polyp of colon: Secondary | ICD-10-CM | POA: Diagnosis not present

## 2023-09-26 DIAGNOSIS — H524 Presbyopia: Secondary | ICD-10-CM | POA: Diagnosis not present

## 2023-10-07 DIAGNOSIS — M5440 Lumbago with sciatica, unspecified side: Secondary | ICD-10-CM | POA: Diagnosis not present

## 2023-10-07 DIAGNOSIS — M25551 Pain in right hip: Secondary | ICD-10-CM | POA: Diagnosis not present
